# Patient Record
Sex: Male | Born: 1980
Health system: Southern US, Community
[De-identification: ages and names within clinical notes are randomized; demographics above are authoritative.]

## PROBLEM LIST (undated history)

## (undated) DIAGNOSIS — I10 Essential (primary) hypertension: Secondary | ICD-10-CM

## (undated) DIAGNOSIS — I517 Cardiomegaly: Secondary | ICD-10-CM

---

## 2003-11-28 ENCOUNTER — Ambulatory Visit: Payer: Self-pay | Admitting: Internal Medicine

## 2004-06-04 ENCOUNTER — Emergency Department (HOSPITAL_COMMUNITY): Admission: EM | Admit: 2004-06-04 | Discharge: 2004-06-04 | Payer: Self-pay | Admitting: Emergency Medicine

## 2004-07-08 ENCOUNTER — Inpatient Hospital Stay (HOSPITAL_COMMUNITY): Admission: AC | Admit: 2004-07-08 | Discharge: 2004-07-09 | Payer: Self-pay

## 2006-09-21 DIAGNOSIS — R519 Headache, unspecified: Secondary | ICD-10-CM | POA: Insufficient documentation

## 2006-09-21 DIAGNOSIS — R51 Headache: Secondary | ICD-10-CM | POA: Insufficient documentation

## 2007-03-28 ENCOUNTER — Emergency Department (HOSPITAL_COMMUNITY): Admission: EM | Admit: 2007-03-28 | Discharge: 2007-03-28 | Payer: Self-pay | Admitting: Emergency Medicine

## 2010-03-17 ENCOUNTER — Emergency Department (HOSPITAL_COMMUNITY)
Admission: EM | Admit: 2010-03-17 | Discharge: 2010-03-17 | Disposition: A | Discharge status: Home / Self Care | Payer: Self-pay | Attending: Emergency Medicine | Admitting: Emergency Medicine

## 2010-03-17 DIAGNOSIS — H6690 Otitis media, unspecified, unspecified ear: Secondary | ICD-10-CM | POA: Insufficient documentation

## 2010-03-17 DIAGNOSIS — H9209 Otalgia, unspecified ear: Secondary | ICD-10-CM | POA: Insufficient documentation

## 2010-08-12 ENCOUNTER — Inpatient Hospital Stay (INDEPENDENT_AMBULATORY_CARE_PROVIDER_SITE_OTHER)
Admission: RE | Admit: 2010-08-12 | Discharge: 2010-08-12 | Disposition: A | Discharge status: Home / Self Care | Payer: BC Managed Care – PPO | Source: Ambulatory Visit | Attending: Family Medicine | Admitting: Family Medicine

## 2010-08-12 DIAGNOSIS — R42 Dizziness and giddiness: Secondary | ICD-10-CM

## 2010-08-12 LAB — GLUCOSE, CAPILLARY: Glucose-Capillary: 113 mg/dL — ABNORMAL HIGH (ref 70–99)

## 2010-08-12 LAB — POCT URINALYSIS DIP (DEVICE): Protein, ur: NEGATIVE mg/dL

## 2010-10-11 LAB — CBC
HCT: 41.3
Hemoglobin: 13.8
MCHC: 33.5
MCV: 85.9
RDW: 13.2
WBC: 5.9

## 2010-10-11 LAB — DIFFERENTIAL
Basophils Relative: 1
Eosinophils Absolute: 0.1
Lymphs Abs: 1.7
Monocytes Absolute: 0.5
Neutro Abs: 3.5
Neutrophils Relative %: 60

## 2010-10-11 LAB — POCT I-STAT CREATININE: Creatinine, Ser: 1.5

## 2010-10-11 LAB — I-STAT 8, (EC8 V) (CONVERTED LAB)
Chloride: 109
HCT: 45
Operator id: 270651
Potassium: 4.1
Sodium: 141
TCO2: 25

## 2011-07-15 ENCOUNTER — Encounter (HOSPITAL_COMMUNITY): Payer: Self-pay

## 2011-07-15 ENCOUNTER — Emergency Department (HOSPITAL_COMMUNITY)
Admission: EM | Admit: 2011-07-15 | Discharge: 2011-07-15 | Disposition: A | Discharge status: Home / Self Care | Payer: BC Managed Care – PPO | Source: Home / Self Care | Attending: Family Medicine | Admitting: Family Medicine

## 2011-07-15 DIAGNOSIS — I517 Cardiomegaly: Secondary | ICD-10-CM | POA: Insufficient documentation

## 2011-07-15 DIAGNOSIS — R0789 Other chest pain: Secondary | ICD-10-CM

## 2011-07-15 HISTORY — DX: Cardiomegaly: I51.7

## 2011-07-15 HISTORY — DX: Essential (primary) hypertension: I10

## 2011-07-15 NOTE — ED Provider Notes (Signed)
History     CSN: [id number]  Arrival date & time 07/15/11  1246   First MD Initiated Contact with Patient 07/15/11 1247      Chief Complaint  Patient presents with  . Chest Pain    (Consider location/radiation/quality/duration/timing/severity/associated sxs/prior treatment) Patient is a 31 y.o. male presenting with chest pain. The history is provided by the patient.  Chest Pain The chest pain began yesterday (present last eve after work, had been sneezing a lot from new work at [employer].). Chest pain occurs constantly. The chest pain is unchanged. The pain is associated with coughing. The severity of the pain is mild. The quality of the pain is described as sharp. The pain does not radiate. Chest pain is worsened by certain positions. Pertinent negatives for primary symptoms include no shortness of breath, no cough, no wheezing and no palpitations.  His past medical history is significant for hypertension. Past medical history comments: known to have enlarged heart.     Past Medical History  Diagnosis Date  . Hypertension   . Enlarged heart     History reviewed. No pertinent past surgical history.  History reviewed. No pertinent family history.  History  Substance Use Topics  . Smoking status: Former Games developer  . Smokeless tobacco: Not on file  . Alcohol Use: Yes      Review of Systems  Constitutional: Negative.   HENT: Positive for sneezing.   Respiratory: Negative for cough, chest tightness, shortness of breath and wheezing.   Cardiovascular: Positive for chest pain. Negative for palpitations.  Neurological: Negative.     Allergies  Review of patient's allergies indicates not on file.  Home Medications   Current Outpatient Rx  Name Route Sig Dispense Refill  . OLMESARTAN MEDOXOMIL 40 MG PO TABS Oral Take 40 mg by mouth daily.      BP 104/70  Pulse 86  Temp 98.9 F (37.2 C) (Oral)  SpO2 96%  Physical Exam  Nursing note and vitals  reviewed. Constitutional: He is oriented to person, place, and time. He appears well-developed and well-nourished.  HENT:  Head: Normocephalic.  Mouth/Throat: Oropharynx is clear and moist.  Eyes: Conjunctivae are normal. Pupils are equal, round, and reactive to light.  Neck: Normal range of motion. Neck supple.  Cardiovascular: Normal rate, regular rhythm, normal heart sounds and intact distal pulses.   Pulmonary/Chest: Breath sounds normal. He exhibits tenderness.    Lymphadenopathy:    He has no cervical adenopathy.  Neurological: He is alert and oriented to person, place, and time.  Skin: Skin is warm and dry.    ED Course  Procedures (including critical care time)  Labs Reviewed - No data to display No results found.   1. Musculoskeletal chest pain       MDM  [id number]--[id number].        Linna Hoff, MD 07/15/11 1336

## 2011-07-15 NOTE — ED Notes (Addendum)
Chest pain since last when he got off work, woke this AM , still having pain, slept off and on , pain never left; pain is sharp, constant; using benicar for BP ; pain constant, sharp. C/o pain worse w direct pressure, deep breaths, or sneezing; NAD, w/d/color good

## 2011-07-15 NOTE — Discharge Instructions (Signed)
advil or tylenol for soreness, ok to work as scheduled.

## 2012-12-20 ENCOUNTER — Emergency Department (HOSPITAL_COMMUNITY): Payer: BC Managed Care – PPO

## 2012-12-20 ENCOUNTER — Emergency Department (INDEPENDENT_AMBULATORY_CARE_PROVIDER_SITE_OTHER): Payer: BC Managed Care – PPO

## 2012-12-20 ENCOUNTER — Emergency Department (INDEPENDENT_AMBULATORY_CARE_PROVIDER_SITE_OTHER)
Admission: EM | Admit: 2012-12-20 | Discharge: 2012-12-20 | Disposition: A | Discharge status: Home / Self Care | Payer: BC Managed Care – PPO | Source: Home / Self Care

## 2012-12-20 ENCOUNTER — Encounter (HOSPITAL_COMMUNITY): Payer: Self-pay | Admitting: Emergency Medicine

## 2012-12-20 DIAGNOSIS — J189 Pneumonia, unspecified organism: Secondary | ICD-10-CM

## 2012-12-20 MED ORDER — HYDROCODONE-ACETAMINOPHEN 5-325 MG PO TABS: 0.5000 | ORAL_TABLET | Freq: Every evening | ORAL | Status: DC | PRN

## 2012-12-20 MED ORDER — LEVOFLOXACIN 500 MG PO TABS: 500.0000 mg | ORAL_TABLET | Freq: Every day | ORAL | Status: DC

## 2012-12-20 NOTE — Discharge Instructions (Signed)
Thank you for coming in today. Take Levaquin daily for pneumonia. Use Norco for cough. Take up to 2 Aleve twice daily for pain. Call or go to the emergency room if you get worse, have trouble breathing, have chest pains, or palpitations.

## 2012-12-20 NOTE — ED Provider Notes (Addendum)
Seth Wang is a 32 y.o. male who presents to Urgent Care today for cough and right-sided back and chest pain present for the last several days. The pain is moderate. The pain is worse with deep inspiration and coughing. No fevers or chills. No nausea vomiting diarrhea. The pain is stabbing in nature. The pain is nonexertional and does not radiate. No shortness of breath or palpitations.   Past Medical History  Diagnosis Date  . Hypertension   . Enlarged heart    History  Substance Use Topics  . Smoking status: Former Games developer  . Smokeless tobacco: Not on file  . Alcohol Use: Yes   ROS as above Medications reviewed. No current facility-administered medications for this encounter.   Current Outpatient Prescriptions  Medication Sig Dispense Refill  . HYDROCHLOROTHIAZIDE PO Take by mouth.      Marland Kitchen LISINOPRIL PO Take by mouth.      Marland Kitchen HYDROcodone-acetaminophen (NORCO/VICODIN) 5-325 MG per tablet Take 0.5 tablets by mouth at bedtime as needed (cough).  6 tablet  0  . levofloxacin (LEVAQUIN) 500 MG tablet Take 1 tablet (500 mg total) by mouth daily.  10 tablet  0  . olmesartan (BENICAR) 40 MG tablet Take 40 mg by mouth daily.        Exam:  BP 153/92  Pulse 79  Temp(Src) 98.2 F (36.8 C) (Oral)  Resp 16  SpO2 100% Gen: Well NAD obese HEENT: EOMI,  MMM Lungs: Normal work of breathing. CTABL Heart: RRR no MRG Abd: NABS, Soft. NT, ND Exts: Non edematous BL  LE, warm and well perfused.   No results found for this or any previous visit (from the past 24 hour(s)). Dg Chest 2 View  12/20/2012   CLINICAL DATA:  Chest pain.  EXAM: CHEST  2 VIEW  COMPARISON:  Prior images could not be retrieved.  FINDINGS: Medial right base infiltrate present consistent with pneumonia. Poor inspiration. Cardiomegaly. No pulmonary venous congestion. No acute bony abnormality.  IMPRESSION: Medial right lung base infiltrate consistent with mild pneumonia. Poor inspiration.   Electronically Signed   By: Maisie Fus   Register   On: 12/20/2012 12:15   Twelve-lead EKG shows normal sinus rhythm at 77 beats per minute. No significant abnormalities noted.  Assessment and Plan: 32 y.o. male with right lobe community-acquired pneumonia.  Plan to treat with Levaquin, and hydrocodone for cough. Will use NSAIDs for severe pain. Recommend followup with primary care provider. Discussed warning signs or symptoms. Please see discharge instructions. Patient expresses understanding.      Rodolph Bong, MD 12/20/12 1229  Rodolph Bong, MD 12/20/12 [phone]

## 2012-12-20 NOTE — ED Notes (Signed)
Pt  Reports    Chest  Pain  More  On r  Side  Breath       As   Well  As  Shortness  Of  Breath   For  sev  Days    Describes  The  Pain as heavy        Pt  Reports  He  Recentlly  Got over  A  resp infection    Pt  Is  Sitting  Upright on exam table  Speaking in  Complete  sentances

## 2013-11-06 ENCOUNTER — Encounter (HOSPITAL_COMMUNITY): Payer: Self-pay | Admitting: Emergency Medicine

## 2013-11-06 ENCOUNTER — Emergency Department (HOSPITAL_COMMUNITY)
Admission: EM | Admit: 2013-11-06 | Discharge: 2013-11-06 | Disposition: A | Discharge status: Home / Self Care | Payer: BC Managed Care – PPO | Attending: Emergency Medicine | Admitting: Emergency Medicine

## 2013-11-06 DIAGNOSIS — Y92410 Unspecified street and highway as the place of occurrence of the external cause: Secondary | ICD-10-CM | POA: Diagnosis not present

## 2013-11-06 DIAGNOSIS — M549 Dorsalgia, unspecified: Secondary | ICD-10-CM

## 2013-11-06 DIAGNOSIS — Z87891 Personal history of nicotine dependence: Secondary | ICD-10-CM | POA: Diagnosis not present

## 2013-11-06 DIAGNOSIS — Y9389 Activity, other specified: Secondary | ICD-10-CM | POA: Insufficient documentation

## 2013-11-06 DIAGNOSIS — S3992XA Unspecified injury of lower back, initial encounter: Secondary | ICD-10-CM | POA: Insufficient documentation

## 2013-11-06 DIAGNOSIS — Z792 Long term (current) use of antibiotics: Secondary | ICD-10-CM | POA: Insufficient documentation

## 2013-11-06 DIAGNOSIS — S199XXA Unspecified injury of neck, initial encounter: Secondary | ICD-10-CM | POA: Diagnosis not present

## 2013-11-06 DIAGNOSIS — Z79899 Other long term (current) drug therapy: Secondary | ICD-10-CM | POA: Diagnosis not present

## 2013-11-06 DIAGNOSIS — I10 Essential (primary) hypertension: Secondary | ICD-10-CM | POA: Insufficient documentation

## 2013-11-06 DIAGNOSIS — M542 Cervicalgia: Secondary | ICD-10-CM

## 2013-11-06 MED ORDER — CYCLOBENZAPRINE HCL 10 MG PO TABS: 10.0000 mg | ORAL_TABLET | Freq: Two times a day (BID) | ORAL | Status: DC | PRN

## 2013-11-06 MED ORDER — IBUPROFEN 800 MG PO TABS: 800.0000 mg | ORAL_TABLET | Freq: Three times a day (TID) | ORAL | Status: DC

## 2013-11-06 MED ORDER — HYDROCODONE-ACETAMINOPHEN 5-325 MG PO TABS: 2.0000 | ORAL_TABLET | ORAL | Status: DC | PRN

## 2013-11-06 NOTE — Discharge Instructions (Signed)
Back Pain, Adult °Low back pain is very common. About 1 in 5 people have back pain. The cause of low back pain is rarely dangerous. The pain often gets better over time. About half of people with a sudden onset of back pain feel better in just 2 weeks. About 8 in 10 people feel better by 6 weeks.  °CAUSES °Some common causes of back pain include: °· Strain of the muscles or ligaments supporting the spine. °· Wear and tear (degeneration) of the spinal discs. °· Arthritis. °· Direct injury to the back. °DIAGNOSIS °Most of the time, the direct cause of low back pain is not known. However, back pain can be treated effectively even when the exact cause of the pain is unknown. Answering your caregiver's questions about your overall health and symptoms is one of the most accurate ways to make sure the cause of your pain is not dangerous. If your caregiver needs more information, he or she may order lab work or imaging tests (X-rays or MRIs). However, even if imaging tests show changes in your back, this usually does not require surgery. °HOME CARE INSTRUCTIONS °For many people, back pain returns. Since low back pain is rarely dangerous, it is often a condition that people can learn to manage on their own.  °· Remain active. It is stressful on the back to sit or stand in one place. Do not sit, drive, or stand in one place for more than 30 minutes at a time. Take short walks on level surfaces as soon as pain allows. Try to increase the length of time you walk each day. °· Do not stay in bed. Resting more than 1 or 2 days can delay your recovery. °· Do not avoid exercise or work. Your body is made to move. It is not dangerous to be active, even though your back may hurt. Your back will likely heal faster if you return to being active before your pain is gone. °· Pay attention to your body when you  bend and lift. Many people have less discomfort when lifting if they bend their knees, keep the load close to their bodies, and  avoid twisting. Often, the most comfortable positions are those that put less stress on your recovering back. °· Find a comfortable position to sleep. Use a firm mattress and lie on your side with your knees slightly bent. If you lie on your back, put a pillow under your knees. °· Only take over-the-counter or prescription medicines as directed by your caregiver. Over-the-counter medicines to reduce pain and inflammation are often the most helpful. Your caregiver may prescribe muscle relaxant drugs. These medicines help dull your pain so you can more quickly return to your normal activities and healthy exercise. °· Put ice on the injured area. °· Put ice in a plastic bag. °· Place a towel between your skin and the bag. °· Leave the ice on for 15-20 minutes, 03-04 times a day for the first 2 to 3 days. After that, ice and heat may be alternated to reduce pain and spasms. °· Ask your caregiver about trying back exercises and gentle massage. This may be of some benefit. °· Avoid feeling anxious or stressed. Stress increases muscle tension and can worsen back pain. It is important to recognize when you are anxious or stressed and learn ways to manage it. Exercise is a great option. °SEEK MEDICAL CARE IF: °· You have pain that is not relieved with rest or medicine. °· You have pain that does not improve in 1 week. °· You have new symptoms. °· You are generally not feeling well. °SEEK   IMMEDIATE MEDICAL CARE IF:  °· You have pain that radiates from your back into your legs. °· You develop new bowel or bladder control problems. °· You have unusual weakness or numbness in your arms or legs. °· You develop nausea or vomiting. °· You develop abdominal pain. °· You feel faint. °Document Released: 01/03/2005 Document Revised: 07/05/2011 Document Reviewed: 05/07/2013 °ExitCare® Patient Information ©2015 [employer]. This information is not intended to replace advice given to you by your health care provider. Make sure you  discuss any questions you have with your health care provider. ° °Cervical Sprain °A cervical sprain is an injury in the neck in which the strong, fibrous tissues (ligaments) that connect your neck bones stretch or tear. Cervical sprains can range from mild to severe. Severe cervical sprains can cause the neck vertebrae to be unstable. This can lead to damage of the spinal cord and can result in serious nervous system problems. The amount of time it takes for a cervical sprain to get better depends on the cause and extent of the injury. Most cervical sprains heal in 1 to 3 weeks. °CAUSES  °Severe cervical sprains may be caused by:  °· Contact sport injuries (such as from football, rugby, wrestling, hockey, auto racing, gymnastics, diving, martial arts, or boxing).   °· Motor vehicle collisions.   °· Whiplash injuries. This is an injury from a sudden forward and backward whipping movement of the head and neck.  °· Falls.   °Mild cervical sprains may be caused by:  °· Being in an awkward position, such as while cradling a telephone between your ear and shoulder.   °· Sitting in a chair that does not offer proper support.   °· Working at a poorly designed computer station.   °· Looking up or down for long periods of time.   °SYMPTOMS  °· Pain, soreness, stiffness, or a burning sensation in the front, back, or sides of the neck. This discomfort may develop immediately after the injury or slowly, 24 hours or more after the injury.   °· Pain or tenderness directly in the middle of the back of the neck.   °· Shoulder or upper back pain.   °· Limited ability to move the neck.   °· Headache.   °· Dizziness.   °· Weakness, numbness, or tingling in the hands or arms.   °· Muscle spasms.   °· Difficulty swallowing or chewing.   °· Tenderness and swelling of the neck.   °DIAGNOSIS  °Most of the time your health care provider can diagnose a cervical sprain by taking your history and doing a physical exam. Your health care  provider will ask about previous neck injuries and any known neck problems, such as arthritis in the neck. X-rays may be taken to find out if there are any other problems, such as with the bones of the neck. Other tests, such as a CT scan or MRI, may also be needed.  °TREATMENT  °Treatment depends on the severity of the cervical sprain. Mild sprains can be treated with rest, keeping the neck in place (immobilization), and pain medicines. Severe cervical sprains are immediately immobilized. Further treatment is done to help with pain, muscle spasms, and other symptoms and may include: °· Medicines, such as pain relievers, numbing medicines, or muscle relaxants.   °· Physical therapy. This may involve stretching exercises, strengthening exercises, and posture training. Exercises and improved posture can help stabilize the neck, strengthen muscles, and help stop symptoms from returning.   °HOME CARE INSTRUCTIONS  °· Put ice on the injured area.   °¨ Put ice in a   plastic bag.   °¨ Place a towel between your skin and the bag.   °¨ Leave the ice on for 15-20 minutes, 3-4 times a day.   °· If your injury was severe, you may have been given a cervical collar to wear. A cervical collar is a two-piece collar designed to keep your neck from moving while it heals. °¨ Do not remove the collar unless instructed by your health care provider. °¨ If you have long hair, keep it outside of the collar. °¨ Ask your health care provider before making any adjustments to your collar. Minor adjustments may be required over time to improve comfort and reduce pressure on your chin or on the back of your head. °¨ If you are allowed to remove the collar for cleaning or bathing, follow your health care provider's instructions on how to do so safely. °¨ Keep your collar clean by wiping it with mild soap and water and drying it completely. If the collar you have been given includes removable pads, remove them every 1-2 days and hand wash them with  soap and water. Allow them to air dry. They should be completely dry before you wear them in the collar. °¨ If you are allowed to remove the collar for cleaning and bathing, wash and dry the skin of your neck. Check your skin for irritation or sores. If you see any, tell your health care provider. °¨ Do not drive while wearing the collar.   °· Only take over-the-counter or prescription medicines for pain, discomfort, or fever as directed by your health care provider.   °· Keep all follow-up appointments as directed by your health care provider.   °· Keep all physical therapy appointments as directed by your health care provider.   °· Make any needed adjustments to your workstation to promote good posture.   °· Avoid positions and activities that make your symptoms worse.   °· Warm up and stretch before being active to help prevent problems.   °SEEK MEDICAL CARE IF:  °· Your pain is not controlled with medicine.   °· You are unable to decrease your pain medicine over time as planned.   °· Your activity level is not improving as expected.   °SEEK IMMEDIATE MEDICAL CARE IF:  °· You develop any bleeding. °· You develop stomach upset. °· You have signs of an allergic reaction to your medicine.   °· Your symptoms get worse.   °· You develop new, unexplained symptoms.   °· You have numbness, tingling, weakness, or paralysis in any part of your body.   °MAKE SURE YOU:  °· Understand these instructions. °· Will watch your condition. °· Will get help right away if you are not doing well or get worse. °Document Released: 10/31/2006 Document Revised: 01/08/2013 Document Reviewed: 07/11/2012 °ExitCare® Patient Information ©2015 [employer]. This information is not intended to replace advice given to you by your health care provider. Make sure you discuss any questions you have with your health care provider. ° °Motor Vehicle Collision °It is common to have multiple bruises and sore muscles after a motor vehicle collision  (MVC). These tend to feel worse for the first 24 hours. You may have the most stiffness and soreness over the first several hours. You may also feel worse when you wake up the first morning after your collision. After this point, you will usually begin to improve with each day. The speed of improvement often depends on the severity of the collision, the number of injuries, and the location and nature of these injuries. °HOME CARE INSTRUCTIONS °· Put ice   on the injured area. °¨ Put ice in a plastic bag. °¨ Place a towel between your skin and the bag. °¨ Leave the ice on for 15-20 minutes, 3-4 times a day, or as directed by your health care provider. °· Drink enough fluids to keep your urine clear or pale yellow. Do not drink alcohol. °· Take a warm shower or bath once or twice a day. This will increase blood flow to sore muscles. °· You may return to activities as directed by your caregiver. Be careful when lifting, as this may aggravate neck or back pain. °· Only take over-the-counter or prescription medicines for pain, discomfort, or fever as directed by your caregiver. Do not use aspirin. This may increase bruising and bleeding. °SEEK IMMEDIATE MEDICAL CARE IF: °· You have numbness, tingling, or weakness in the arms or legs. °· You develop severe headaches not relieved with medicine. °· You have severe neck pain, especially tenderness in the middle of the back of your neck. °· You have changes in bowel or bladder control. °· There is increasing pain in any area of the body. °· You have shortness of breath, light-headedness, dizziness, or fainting. °· You have chest pain. °· You feel sick to your stomach (nauseous), throw up (vomit), or sweat. °· You have increasing abdominal discomfort. °· There is blood in your urine, stool, or vomit. °· You have pain in your shoulder (shoulder strap areas). °· You feel your symptoms are getting worse. °MAKE SURE YOU: °· Understand these instructions. °· Will watch your  condition. °· Will get help right away if you are not doing well or get worse. °Document Released: 01/03/2005 Document Revised: 05/20/2013 Document Reviewed: 06/02/2010 °ExitCare® Patient Information ©2015 [employer]. This information is not intended to replace advice given to you by your health care provider. Make sure you discuss any questions you have with your health care provider. ° °
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
## 2013-11-07 NOTE — ED Provider Notes (Signed)
Medical screening examination/treatment/procedure(s) were performed by non-physician practitioner and as supervising physician I was immediately available for consultation/collaboration.   EKG Interpretation None        Gwyneth SproutWhitney Sacred Roa, MD 11/07/13 2344

## 2016-01-24 ENCOUNTER — Encounter (HOSPITAL_BASED_OUTPATIENT_CLINIC_OR_DEPARTMENT_OTHER): Payer: Self-pay | Admitting: *Deleted

## 2016-01-24 ENCOUNTER — Emergency Department (HOSPITAL_BASED_OUTPATIENT_CLINIC_OR_DEPARTMENT_OTHER)
Admission: EM | Admit: 2016-01-24 | Discharge: 2016-01-24 | Disposition: A | Discharge status: Home / Self Care | Payer: BLUE CROSS/BLUE SHIELD | Attending: Emergency Medicine | Admitting: Emergency Medicine

## 2016-01-24 DIAGNOSIS — Z87891 Personal history of nicotine dependence: Secondary | ICD-10-CM | POA: Insufficient documentation

## 2016-01-24 DIAGNOSIS — R0602 Shortness of breath: Secondary | ICD-10-CM | POA: Diagnosis not present

## 2016-01-24 DIAGNOSIS — J111 Influenza due to unidentified influenza virus with other respiratory manifestations: Secondary | ICD-10-CM

## 2016-01-24 DIAGNOSIS — J3489 Other specified disorders of nose and nasal sinuses: Secondary | ICD-10-CM | POA: Insufficient documentation

## 2016-01-24 DIAGNOSIS — R05 Cough: Secondary | ICD-10-CM | POA: Diagnosis not present

## 2016-01-24 DIAGNOSIS — R0789 Other chest pain: Secondary | ICD-10-CM | POA: Insufficient documentation

## 2016-01-24 DIAGNOSIS — I10 Essential (primary) hypertension: Secondary | ICD-10-CM | POA: Diagnosis not present

## 2016-01-24 DIAGNOSIS — R0981 Nasal congestion: Secondary | ICD-10-CM | POA: Diagnosis not present

## 2016-01-24 DIAGNOSIS — R69 Illness, unspecified: Secondary | ICD-10-CM

## 2016-01-24 DIAGNOSIS — R197 Diarrhea, unspecified: Secondary | ICD-10-CM | POA: Insufficient documentation

## 2016-01-24 MED ORDER — ACETAMINOPHEN 325 MG PO TABS
650.0000 mg | ORAL_TABLET | Freq: Once | ORAL | Status: AC | PRN
  Administered 2016-01-24: 650 mg via ORAL
  Filled 2016-01-24: qty 2

## 2016-01-24 MED ORDER — HYDROCODONE-HOMATROPINE 5-1.5 MG/5ML PO SYRP: 5.0000 mL | ORAL_SOLUTION | Freq: Four times a day (QID) | ORAL | 0 refills | Status: AC | PRN

## 2016-01-24 NOTE — ED Notes (Signed)
Pt given d/c instructions as per chart. Verbalizes understanding. No questions. 

## 2016-01-24 NOTE — ED Provider Notes (Signed)
[facility] Provider Note   CSN: [id number] Arrival date & time: 01/24/16  1445   By signing my name below, I, Teofilo PodMatthew P. Jamison, attest that this documentation has been prepared under the direction and in the presence of Gwyneth SproutWhitney Sophina Mitten, MD . Electronically Signed: Teofilo PodMatthew P. Jamison, ED Scribe. 01/24/2016. 4:20 PM.   History   Chief Complaint Chief Complaint  Patient presents with  . URI    The history is provided by the patient. No language interpreter was used.  HPI Comments:  Seth Wang is a 36 y.o. male with PMHx of an enlarged heart who presents to the Emergency Department complaining of multiple URI symptoms x 1 day. Pt complains of associated chest pain discomfort with coughing, congestion, nonproductive cough, rhinorrhea, chills, diarrhea, and intermittent SOB. Pt states that his chest pain is worsened when coughing. Pt reports sick contact with his 2 children. Pt is a smoker and drinks EtOH occasionally. No alleviating factors noted. Pt denies vomiting, abdominal pain, sore throat.   Past Medical History:  Diagnosis Date  . Enlarged heart   . Hypertension     Patient Active Problem List   Diagnosis Date Noted  . Enlarged heart   . HEADACHE 09/21/2006    History reviewed. No pertinent surgical history.     Home Medications    Prior to Admission medications   Not on File    Family History History reviewed. No pertinent family history.  Social History Social History  Substance Use Topics  . Smoking status: Former Smoker    Packs/day: 0.50    Types: Cigarettes  . Smokeless tobacco: Never Used  . Alcohol use Yes     Allergies   Patient has no known allergies.   Review of Systems Review of Systems  Constitutional: Positive for chills.  HENT: Positive for congestion and rhinorrhea. Negative for sore throat.   Respiratory: Positive for cough and shortness of breath.   Cardiovascular: Positive for chest pain.  Gastrointestinal:  Positive for diarrhea. Negative for abdominal pain and vomiting.  All other systems reviewed and are negative.    Physical Exam Updated Vital Signs BP (!) 159/108 (BP Location: Left Arm) Comment: noncompliant with BP meds x 4-5 months.  Pulse 90   Temp 98.3 F (36.8 C) (Oral)   Resp 18   Ht 5' 9.75" (1.772 m)   Wt (!) 328 lb (148.8 kg)   SpO2 99%   BMI 47.40 kg/m   Physical Exam  Constitutional: He appears well-developed and well-nourished.  HENT:  Head: Normocephalic and atraumatic.  Swollen nasal turbinates, bilateral middle ear effusions.   Eyes: Conjunctivae are normal.  Neck: Neck supple.  Cardiovascular: Normal rate, regular rhythm, normal heart sounds and intact distal pulses.  Exam reveals no friction rub.   No murmur heard. Pulmonary/Chest: Effort normal and breath sounds normal. No respiratory distress. He has no wheezes. He has no rales.  Abdominal: Soft. There is no tenderness.  Musculoskeletal: He exhibits no edema.  Neurological: He is alert.  Skin: Skin is warm and dry.  Psychiatric: He has a normal mood and affect.  Nursing note and vitals reviewed.    ED Treatments / Results  DIAGNOSTIC STUDIES:  Oxygen Saturation is 99% on RA, normal by my interpretation.    COORDINATION OF CARE:  4:20 PM Discussed treatment plan with pt at bedside and pt agreed to plan.   Labs (all labs ordered are listed, but only abnormal results are displayed) Labs Reviewed - No data to  display  EKG  EKG Interpretation None       Radiology No results found.  Procedures Procedures (including critical care time)  Medications Ordered in ED Medications - No data to display   Initial Impression / Assessment and Plan / ED Course  I have reviewed the triage vital signs and the nursing notes.  Pertinent labs & imaging results that were available during my care of the patient were reviewed by me and considered in my medical decision making (see chart for  details).  Clinical Course     Pt with symptoms consistent with influenza.  Normal exam here but is febrile.  No signs of breathing difficulty  No signs of strep pharyngitis, otitis or abnormal abdominal findings.   Will continue antipyretica and rest and fluids and return for any further problems.  Final Clinical Impressions(s) / ED Diagnoses   Final diagnoses:  Influenza-like illness    New Prescriptions New Prescriptions   HYDROCODONE-HOMATROPINE (HYCODAN) 5-1.5 MG/5ML SYRUP    Take 5 mLs by mouth every 6 (six) hours as needed for cough.   I personally performed the services described in this documentation, which was scribed in my presence.  The recorded information has been reviewed and considered.     Gwyneth Sprout, MD 01/24/16 1630

## 2016-01-24 NOTE — ED Triage Notes (Signed)
URI x 1 day.  unknown fever PTA.

## 2016-01-24 NOTE — ED Notes (Signed)
ED Provider at bedside. 

## 2016-02-26 ENCOUNTER — Emergency Department (HOSPITAL_COMMUNITY)
Admission: EM | Admit: 2016-02-26 | Discharge: 2016-02-26 | Disposition: A | Discharge status: Home / Self Care | Payer: BLUE CROSS/BLUE SHIELD | Attending: Emergency Medicine | Admitting: Emergency Medicine

## 2016-02-26 ENCOUNTER — Encounter (HOSPITAL_COMMUNITY): Payer: Self-pay | Admitting: *Deleted

## 2016-02-26 DIAGNOSIS — R202 Paresthesia of skin: Secondary | ICD-10-CM | POA: Insufficient documentation

## 2016-02-26 DIAGNOSIS — Z87891 Personal history of nicotine dependence: Secondary | ICD-10-CM | POA: Diagnosis not present

## 2016-02-26 DIAGNOSIS — I10 Essential (primary) hypertension: Secondary | ICD-10-CM | POA: Insufficient documentation

## 2016-02-26 DIAGNOSIS — M79602 Pain in left arm: Secondary | ICD-10-CM | POA: Diagnosis present

## 2016-02-26 MED ORDER — HYDROCHLOROTHIAZIDE 25 MG PO TABS: 25.0000 mg | ORAL_TABLET | Freq: Every day | ORAL | 0 refills | Status: AC

## 2016-02-26 NOTE — ED Provider Notes (Signed)
[facility] Provider Note   CSN: [id number] Arrival date & time: 02/26/16  1046   By signing my name below, I, Teofilo PodMatthew P. Jamison, attest that this documentation has been prepared under the direction and in the presence of Fayrene HelperBowie Coriana Angello, PA-C. Electronically Signed: Teofilo PodMatthew P. Jamison, ED Scribe. 02/26/2016. 12:54 PM.   History   Chief Complaint Chief Complaint  Patient presents with  . Arm Pain    The history is provided by the patient. No language interpreter was used.   HPI Comments:  Greg CutterLamar Washabaugh is a 36 y.o. male with PMHx of HTN and enlarged heart who presents to the Emergency Department complaining of intermittent left arm numbness x 2 weeks. Pt states that his arm is not painful, that the numbness radiates throughout his left arm, and states that yesterday the numbness did not resolve. He states that at this time only his left hand is numb. Pt rates the discomfort at 6/10. Pt complains of associated intermittent chest pain/SOB. Pt states that he has not had chest pain since having a 40 minute episode yesterday. Pt states that he has been out of his hydrochlorothiazide for 3 months. Pt is a smoker, is right hand dominant, and denies family hx of cardiac problems or MS. No alleviating factors noted. Pt denies headache, neck pain, lightheadedness, dizziness, diaphoresis, visual changes.   Past Medical History:  Diagnosis Date  . Enlarged heart   . Hypertension     Patient Active Problem List   Diagnosis Date Noted  . Enlarged heart   . HEADACHE 09/21/2006    History reviewed. No pertinent surgical history.     Home Medications    Prior to Admission medications   Medication Sig Start Date End Date Taking? Authorizing Provider  HYDROcodone-homatropine (HYCODAN) 5-1.5 MG/5ML syrup Take 5 mLs by mouth every 6 (six) hours as needed for cough. 01/24/16   Gwyneth SproutWhitney Plunkett, MD    Family History No family history on file.  Social History Social History  Substance  Use Topics  . Smoking status: Former Smoker    Packs/day: 0.50    Types: Cigarettes  . Smokeless tobacco: Never Used  . Alcohol use Yes     Allergies   Patient has no known allergies.   Review of Systems Review of Systems  Constitutional: Negative for diaphoresis.  Eyes: Negative for visual disturbance.  Respiratory: Positive for shortness of breath.   Cardiovascular: Positive for chest pain.  Musculoskeletal: Negative for myalgias and neck pain.  Neurological: Positive for numbness. Negative for light-headedness and headaches.     Physical Exam Updated Vital Signs BP 141/97 (BP Location: Left Arm)   Pulse 77   Temp 97.6 F (36.4 C) (Oral)   Resp 14   Ht 5' 9.75" (1.772 m)   Wt (!) 330 lb (149.7 kg)   SpO2 100%   BMI 47.69 kg/m   Physical Exam  Constitutional: He appears well-developed and well-nourished. No distress.  HENT:  Head: Normocephalic and atraumatic.  Eyes: Conjunctivae are normal.  Cardiovascular: Normal rate, regular rhythm and normal heart sounds.   Pulmonary/Chest: Effort normal and breath sounds normal. He has no wheezes. He has no rales.  Abdominal: He exhibits no distension.  Musculoskeletal:  No cervical spine tenderness, neck with full ROM. Subjective decrease in sensation throughout left arm from shoulder to the tip of the finger that does not follow any specific pattern. Normal grip strength, radial pulse 2+.   Neurological: He is alert.  Skin: Skin is warm and  dry. Capillary refill takes less than 2 seconds.  Psychiatric: He has a normal mood and affect.  Nursing note and vitals reviewed.    ED Treatments / Results  DIAGNOSTIC STUDIES:  Oxygen Saturation is 100% on RA, normal by my interpretation.    COORDINATION OF CARE:  12:51 PM Will refill blood pressure medication. Discussed treatment plan with pt at bedside and pt agreed to plan.   Labs (all labs ordered are listed, but only abnormal results are displayed) Labs Reviewed -  No data to display  EKG  EKG Interpretation None       Radiology No results found.  Procedures Procedures (including critical care time)  Medications Ordered in ED Medications - No data to display   Initial Impression / Assessment and Plan / ED Course  I have reviewed the triage vital signs and the nursing notes.  Pertinent labs & imaging results that were available during my care of the patient were reviewed by me and considered in my medical decision making (see chart for details).     BP 141/97 (BP Location: Left Arm)   Pulse 77   Temp 97.6 F (36.4 C) (Oral)   Resp 14   Ht 5' 9.75" (1.772 m)   Wt (!) 149.7 kg   SpO2 100%   BMI 47.69 kg/m    Final Clinical Impressions(s) / ED Diagnoses   Final diagnoses:  Paresthesia of left arm  Asymptomatic hypertension    New Prescriptions New Prescriptions   HYDROCHLOROTHIAZIDE (HYDRODIURIL) 25 MG TABLET    Take 1 tablet (25 mg total) by mouth daily.   1:07 PM Pt here with intermittent paresthesia of the L arm x 2 weeks.  He has subjective finding but no objective finding on exam.  He has normal grip strength, intact bicep and brachioradialis DTR.  Radial pulse intact.  Suspect brachial plexus radiculopathy as he has tenderness at the ERBs point.  He would benefit from close f/u with PCP for further evaluation, including cardiac work up, r/o MS, or other underlying pathology.  I did discussed this with DR. Ray.  Will refill his BP medication.  Return precaution given.  Pt voice understanding and agrees with plan.   I personally performed the services described in this documentation, which was scribed in my presence. The recorded information has been reviewed and is accurate.       Fayrene Helper, PA-C 02/26/16 1310    Margarita Grizzle, MD 03/03/16 1351

## 2016-02-26 NOTE — ED Notes (Signed)
Pt states he understands instructions and will follow up with PMD. Home stable with steady gait.

## 2016-02-26 NOTE — Discharge Instructions (Signed)
Please follow up closely with your primary care provider for further evaluation of your left arm numbness. You will also need to get your medication refill from your primary care provider as well.

## 2016-02-26 NOTE — ED Triage Notes (Signed)
Pt c/o L arm numbness onset x 2 wks ago, pt denies slurred speech, no facial droop, no injury to the area, pt ambulatory, A&O x4

## 2016-02-26 NOTE — ED Notes (Signed)
Unable to sign due to computer. Pt verbalizes understanding. 

## 2016-05-11 DIAGNOSIS — S29012A Strain of muscle and tendon of back wall of thorax, initial encounter: Secondary | ICD-10-CM | POA: Diagnosis not present

## 2016-05-11 DIAGNOSIS — F17210 Nicotine dependence, cigarettes, uncomplicated: Secondary | ICD-10-CM | POA: Diagnosis not present

## 2016-05-11 DIAGNOSIS — R109 Unspecified abdominal pain: Secondary | ICD-10-CM | POA: Diagnosis not present

## 2016-05-11 DIAGNOSIS — I10 Essential (primary) hypertension: Secondary | ICD-10-CM | POA: Diagnosis not present

## 2016-10-10 ENCOUNTER — Encounter (HOSPITAL_COMMUNITY): Payer: Self-pay

## 2016-10-10 ENCOUNTER — Emergency Department (HOSPITAL_COMMUNITY)
Admission: EM | Admit: 2016-10-10 | Discharge: 2016-10-10 | Disposition: A | Discharge status: Home / Self Care | Payer: BLUE CROSS/BLUE SHIELD | Attending: Emergency Medicine | Admitting: Emergency Medicine

## 2016-10-10 DIAGNOSIS — Y9389 Activity, other specified: Secondary | ICD-10-CM | POA: Diagnosis not present

## 2016-10-10 DIAGNOSIS — S060X0A Concussion without loss of consciousness, initial encounter: Secondary | ICD-10-CM | POA: Diagnosis not present

## 2016-10-10 DIAGNOSIS — S46912A Strain of unspecified muscle, fascia and tendon at shoulder and upper arm level, left arm, initial encounter: Secondary | ICD-10-CM

## 2016-10-10 DIAGNOSIS — Y998 Other external cause status: Secondary | ICD-10-CM | POA: Insufficient documentation

## 2016-10-10 DIAGNOSIS — Y92410 Unspecified street and highway as the place of occurrence of the external cause: Secondary | ICD-10-CM | POA: Insufficient documentation

## 2016-10-10 DIAGNOSIS — Z87891 Personal history of nicotine dependence: Secondary | ICD-10-CM | POA: Insufficient documentation

## 2016-10-10 DIAGNOSIS — I10 Essential (primary) hypertension: Secondary | ICD-10-CM | POA: Insufficient documentation

## 2016-10-10 DIAGNOSIS — S0990XA Unspecified injury of head, initial encounter: Secondary | ICD-10-CM | POA: Diagnosis not present

## 2016-10-10 MED ORDER — ACETAMINOPHEN 325 MG PO TABS
650.0000 mg | ORAL_TABLET | Freq: Once | ORAL | Status: AC
  Administered 2016-10-10: 650 mg via ORAL
  Filled 2016-10-10: qty 2
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
## 2017-06-10 DIAGNOSIS — M545 Low back pain: Secondary | ICD-10-CM | POA: Diagnosis not present

## 2017-06-10 DIAGNOSIS — X58XXXA Exposure to other specified factors, initial encounter: Secondary | ICD-10-CM | POA: Diagnosis not present

## 2017-06-10 DIAGNOSIS — Y929 Unspecified place or not applicable: Secondary | ICD-10-CM | POA: Diagnosis not present

## 2017-06-10 DIAGNOSIS — S39012A Strain of muscle, fascia and tendon of lower back, initial encounter: Secondary | ICD-10-CM | POA: Diagnosis not present

## 2017-06-27 ENCOUNTER — Other Ambulatory Visit: Payer: Self-pay

## 2017-06-27 ENCOUNTER — Encounter (HOSPITAL_BASED_OUTPATIENT_CLINIC_OR_DEPARTMENT_OTHER): Payer: Self-pay

## 2017-06-27 ENCOUNTER — Emergency Department (HOSPITAL_BASED_OUTPATIENT_CLINIC_OR_DEPARTMENT_OTHER)
Admission: EM | Admit: 2017-06-27 | Discharge: 2017-06-27 | Disposition: A | Discharge status: Home / Self Care | Payer: BLUE CROSS/BLUE SHIELD | Attending: Emergency Medicine | Admitting: Emergency Medicine

## 2017-06-27 DIAGNOSIS — J020 Streptococcal pharyngitis: Secondary | ICD-10-CM | POA: Diagnosis not present

## 2017-06-27 DIAGNOSIS — J029 Acute pharyngitis, unspecified: Secondary | ICD-10-CM | POA: Diagnosis not present

## 2017-06-27 DIAGNOSIS — I10 Essential (primary) hypertension: Secondary | ICD-10-CM | POA: Insufficient documentation

## 2017-06-27 DIAGNOSIS — Z79899 Other long term (current) drug therapy: Secondary | ICD-10-CM | POA: Diagnosis not present

## 2017-06-27 DIAGNOSIS — F17210 Nicotine dependence, cigarettes, uncomplicated: Secondary | ICD-10-CM | POA: Insufficient documentation

## 2017-06-27 DIAGNOSIS — R05 Cough: Secondary | ICD-10-CM | POA: Diagnosis not present

## 2017-06-27 LAB — RAPID STREP SCREEN (MED CTR MEBANE ONLY): STREPTOCOCCUS, GROUP A SCREEN (DIRECT): POSITIVE — AB

## 2017-06-27 MED ORDER — PREDNISONE 10 MG PO TABS
60.0000 mg | ORAL_TABLET | Freq: Once | ORAL | Status: AC
  Administered 2017-06-27: 60 mg via ORAL
  Filled 2017-06-27: qty 1

## 2017-06-27 MED ORDER — IBUPROFEN 800 MG PO TABS
800.0000 mg | ORAL_TABLET | Freq: Once | ORAL | Status: AC
  Administered 2017-06-27: 800 mg via ORAL
  Filled 2017-06-27: qty 1

## 2017-06-27 MED ORDER — PENICILLIN G BENZATHINE 1200000 UNIT/2ML IM SUSP
1.2000 10*6.[IU] | Freq: Once | INTRAMUSCULAR | Status: AC
  Administered 2017-06-27: 1.2 10*6.[IU] via INTRAMUSCULAR
  Filled 2017-06-27: qty 2

## 2017-06-27 MED ORDER — IBUPROFEN 600 MG PO TABS: 600.0000 mg | ORAL_TABLET | Freq: Four times a day (QID) | ORAL | 0 refills | Status: AC | PRN

## 2017-06-27 MED ORDER — LIDOCAINE VISCOUS HCL 2 % MT SOLN: 15.0000 mL | OROMUCOSAL | 0 refills | Status: AC | PRN

## 2017-06-27 MED ORDER — PREDNISONE 10 MG PO TABS: 40.0000 mg | ORAL_TABLET | Freq: Every day | ORAL | 0 refills | Status: AC

## 2017-06-27 NOTE — ED Provider Notes (Signed)
[facility] EMERGENCY DEPARTMENT Provider Note   CSN: [id number] Arrival date & time: 06/27/17  1955     History   Chief Complaint Chief Complaint  Patient presents with  . Cough    HPI Seth Wang is a 37 y.o. male with a history of hypertension presents emergency department today for sore throat x2 days.  Patient notes that he woke up 2 days ago with sore throat with associated dysphasia as well as nasal congestion, left ear fullness and a dry cough.  He reports that he has had a subjective fever prior to arrival but was found to have temperature when he arrived.  He has been trying over-the-counter Alka-Seltzer for his symptoms without any relief.  He is unsure of sick contacts. Denies chills, inability to control secretions, N/V, abdominal pain, voice change, dental disease, or trauma.   HPI  Past Medical History:  Diagnosis Date  . Enlarged heart   . Hypertension     Patient Active Problem List   Diagnosis Date Noted  . Enlarged heart   . HEADACHE 09/21/2006    History reviewed. No pertinent surgical history.      Home Medications    Prior to Admission medications   Medication Sig Start Date End Date Taking? Authorizing Provider  hydrochlorothiazide (HYDRODIURIL) 25 MG tablet Take 1 tablet (25 mg total) by mouth daily. 02/26/16  Yes Fayrene Helperran, [place], PA-C  HYDROcodone-homatropine [facility](HYCODAN) 5-1.5 MG/5ML syrup Take 5 mLs by mouth every 6 (six) hours as needed for cough. 01/24/16   Gwyneth SproutPlunkett, Whitney, MD    Family History No family history on file.  Social History Social History   Tobacco Use  . Smoking status: Current Every Day Smoker    Packs/day: 0.50    Types: Cigarettes  . Smokeless tobacco: Never Used  Substance Use Topics  . Alcohol use: Yes    Comment: occ  . Drug use: No     Allergies   Patient has no known allergies.   Review of Systems Review of Systems  All other systems reviewed and are negative.    Physical Exam Updated  Vital Signs BP (!) 163/95 (BP Location: Left Arm)   Pulse (!) 104   Temp 99.7 F (37.6 C) (Oral)   Resp 20   Ht 5\' 9"  (1.753 m)   Wt (!) 149.2 kg (329 lb)   SpO2 96%   BMI 48.58 kg/m   Physical Exam  Constitutional: He appears well-developed and well-nourished.  HENT:  Head: Normocephalic and atraumatic.  Right Ear: Tympanic membrane, external ear and ear canal normal.  Left Ear: Tympanic membrane, external ear and ear canal normal.  Nose: Mucosal edema present. Right sinus exhibits no maxillary sinus tenderness and no frontal sinus tenderness. Left sinus exhibits no maxillary sinus tenderness and no frontal sinus tenderness.  Mouth/Throat: Uvula is midline, oropharynx is clear and moist and mucous membranes are normal. No tonsillar exudate.  The patient has normal phonation and is in control of secretions. No stridor.  Midline uvula without edema. Soft palate rises symmetrically. 2+ tonsils with erythema & exudates. No PTA. Tongue protrusion is normal. No trismus. No creptius on neck palpation and patient has good dentition. No gingival erythema or fluctuance noted. Mucus membranes moist.   Eyes: Pupils are equal, round, and reactive to light. Right eye exhibits no discharge. Left eye exhibits no discharge. No scleral icterus.  Neck: Trachea normal. Neck supple. No spinous process tenderness present. No neck rigidity. Normal range of motion present.  Cardiovascular: Normal rate, regular rhythm and intact distal pulses.  No murmur heard. Pulses:      Radial pulses are 2+ on the right side, and 2+ on the left side.       Dorsalis pedis pulses are 2+ on the right side, and 2+ on the left side.       Posterior tibial pulses are 2+ on the right side, and 2+ on the left side.  No lower extremity swelling or edema. Calves symmetric in size bilaterally.  Pulmonary/Chest: Effort normal and breath sounds normal. He exhibits no tenderness.  Abdominal: Soft. Bowel sounds are normal. There is no  tenderness. There is no rebound and no guarding.  Musculoskeletal: He exhibits no edema.  Lymphadenopathy:    He has cervical adenopathy.  Neurological: He is alert.  Skin: Skin is warm and dry. No rash noted. He is not diaphoretic.  Psychiatric: He has a normal mood and affect.  Nursing note and vitals reviewed.    ED Treatments / Results  Labs (all labs ordered are listed, but only abnormal results are displayed) Labs Reviewed  RAPID STREP SCREEN ([facility] ONLY) - Abnormal; Notable for the following components:      Result Value   Streptococcus, Group A Screen (Direct) POSITIVE (*)    All other components within normal limits    EKG None  Radiology No results found.  Procedures Procedures (including critical care time)  Medications Ordered in ED Medications  ibuprofen (ADVIL,MOTRIN) tablet 800 mg (800 mg Oral Given 06/27/17 2010)  penicillin g benzathine (BICILLIN LA) 1200000 UNIT/2ML injection 1.2 Million Units (1.2 Million Units Intramuscular Given 06/27/17 2106)  predniSONE (DELTASONE) tablet 60 mg (60 mg Oral Given 06/27/17 2105)     Initial Impression / Assessment and Plan / ED Course  I have reviewed the triage vital signs and the nursing notes.  Pertinent labs & imaging results that were available during my care of the patient were reviewed by me and considered in my medical decision making (see chart for details).     37 y.o. male with tonsillar exudate, cervical lymphadenopathy, & dysphagia; strep test is positive. Diagnosis of bacterial pharyngitis. Treated in the ED with steroids, NSAIDs and PCN IM.  Pt appears mildly dehydrated, discussed importance of water rehydration. Presentation non concerning for PTA or RPA. No trismus or uvula deviation. Specific return precautions discussed. Pt able to drink water in ED without difficulty with intact air way. Recommended PCP follow up. Vital signs improved prior to discharge.   Vitals:   06/27/17 2004 06/27/17 2005  06/27/17 2047  BP: (!) 163/95    Pulse: (!) 104    Resp: 20    Temp: (!) 101.6 F (38.7 C)  99.7 F (37.6 C)  TempSrc: Oral  Oral  SpO2: 96%    Weight:  (!) 149.2 kg (329 lb)   Height:  5\' 9"  (1.753 m)     Final Clinical Impressions(s) / ED Diagnoses   Final diagnoses:  Strep pharyngitis    ED Discharge Orders        Ordered    predniSONE (DELTASONE) 10 MG tablet  Daily     06/27/17 2129    ibuprofen (ADVIL,MOTRIN) 600 MG tablet  Every 6 hours PRN     06/27/17 2129    lidocaine (XYLOCAINE) 2 % solution  As needed     06/27/17 2129       Princella Pellegrini 06/28/17 0059    Tegeler, Canary Brim, MD  06/28/17 0111  

## 2017-06-27 NOTE — ED Notes (Signed)
ED Provider at bedside. 

## 2017-06-27 NOTE — ED Notes (Signed)
Pt verbalizes understanding of d/c instructions and denies any further need at this time. 

## 2017-06-27 NOTE — ED Triage Notes (Signed)
C/o cough, sore throat day 2-NAD-steady gait

## 2017-06-27 NOTE — Discharge Instructions (Signed)
Please read and follow all provided instructions.  Your diagnoses today include: Strep Throat  Tests performed today include: Vital signs. See below for your results today.  Strep Test: This was positive  Medications prescribed:  You were treated in the department with penicillin. Do not take your medicine if develop an itchy rash, swelling in your mouth or lips, or difficulty breathing. Please take Ibuprofen and Tylenol as needed for fever.  Please use viscous lidocaine (swish, gargle and spit - do not swallow) for additional relief. Please take Prednisone as prescribed.   Home care instructions:  This is a bacterial infection. Continue to stay well-hydrated. Gargle warm salt water and spit it out. Continued to alternate between Tylenol and ibuprofen for pain. May consider over-the-counter Benadryl for additional relief (decrease secretions). Also discard your toothbrush and begin using a new one in 3 days. Follow-up with your primary care doctor in this week for recheck of ongoing symptoms.   Follow-up instructions: Please follow-up with your primary care provider in 2-3 days for follow up.    Return instructions:  Return to the ED sooner for worsening condition, inability to swallow, breathing difficulty, new concerns.  Additional Information:  Your vital signs today were: BP (!) 163/95 (BP Location: Left Arm)    Pulse (!) 104    Temp 99.7 F (37.6 C) (Oral)    Resp 20    Ht 5\' 9"  (1.753 m)    Wt (!) 149.2 kg (329 lb)    SpO2 96%    BMI 48.58 kg/m  If your blood pressure (BP) was elevated above 135/85 this visit, please have this repeated by your doctor within one month. ---------------

## 2017-08-28 DIAGNOSIS — Z0000 Encounter for general adult medical examination without abnormal findings: Secondary | ICD-10-CM | POA: Diagnosis not present

## 2017-08-28 DIAGNOSIS — Z01118 Encounter for examination of ears and hearing with other abnormal findings: Secondary | ICD-10-CM | POA: Diagnosis not present

## 2017-08-28 DIAGNOSIS — Z131 Encounter for screening for diabetes mellitus: Secondary | ICD-10-CM | POA: Diagnosis not present

## 2017-08-28 DIAGNOSIS — Z136 Encounter for screening for cardiovascular disorders: Secondary | ICD-10-CM | POA: Diagnosis not present

## 2017-09-25 DIAGNOSIS — Z01118 Encounter for examination of ears and hearing with other abnormal findings: Secondary | ICD-10-CM | POA: Diagnosis not present

## 2017-09-25 DIAGNOSIS — Z0000 Encounter for general adult medical examination without abnormal findings: Secondary | ICD-10-CM | POA: Diagnosis not present

## 2017-09-25 DIAGNOSIS — I10 Essential (primary) hypertension: Secondary | ICD-10-CM | POA: Diagnosis not present

## 2017-09-25 DIAGNOSIS — Z136 Encounter for screening for cardiovascular disorders: Secondary | ICD-10-CM | POA: Diagnosis not present

## 2017-09-25 DIAGNOSIS — Z5181 Encounter for therapeutic drug level monitoring: Secondary | ICD-10-CM | POA: Diagnosis not present

## 2017-09-25 DIAGNOSIS — Z131 Encounter for screening for diabetes mellitus: Secondary | ICD-10-CM | POA: Diagnosis not present

## 2017-09-25 DIAGNOSIS — R7303 Prediabetes: Secondary | ICD-10-CM | POA: Diagnosis not present

## 2017-09-25 DIAGNOSIS — I119 Hypertensive heart disease without heart failure: Secondary | ICD-10-CM | POA: Diagnosis not present

## 2017-09-25 DIAGNOSIS — Z720 Tobacco use: Secondary | ICD-10-CM | POA: Diagnosis not present

## 2017-10-26 DIAGNOSIS — R221 Localized swelling, mass and lump, neck: Secondary | ICD-10-CM | POA: Diagnosis not present

## 2017-10-26 DIAGNOSIS — J029 Acute pharyngitis, unspecified: Secondary | ICD-10-CM | POA: Diagnosis not present

## 2017-10-26 DIAGNOSIS — F17210 Nicotine dependence, cigarettes, uncomplicated: Secondary | ICD-10-CM | POA: Diagnosis not present

## 2017-10-26 DIAGNOSIS — H9202 Otalgia, left ear: Secondary | ICD-10-CM | POA: Diagnosis not present

## 2017-10-26 DIAGNOSIS — R0603 Acute respiratory distress: Secondary | ICD-10-CM | POA: Diagnosis not present

## 2017-10-26 DIAGNOSIS — I10 Essential (primary) hypertension: Secondary | ICD-10-CM | POA: Diagnosis not present

## 2017-10-26 DIAGNOSIS — J36 Peritonsillar abscess: Secondary | ICD-10-CM | POA: Diagnosis not present

## 2017-10-26 DIAGNOSIS — R590 Localized enlarged lymph nodes: Secondary | ICD-10-CM | POA: Diagnosis not present

## 2017-12-22 DIAGNOSIS — G4733 Obstructive sleep apnea (adult) (pediatric): Secondary | ICD-10-CM | POA: Diagnosis not present

## 2018-01-22 DIAGNOSIS — G4733 Obstructive sleep apnea (adult) (pediatric): Secondary | ICD-10-CM | POA: Diagnosis not present

## 2018-02-22 DIAGNOSIS — G4733 Obstructive sleep apnea (adult) (pediatric): Secondary | ICD-10-CM | POA: Diagnosis not present

## 2018-03-19 DIAGNOSIS — H5789 Other specified disorders of eye and adnexa: Secondary | ICD-10-CM | POA: Diagnosis not present

## 2018-03-19 DIAGNOSIS — H579 Unspecified disorder of eye and adnexa: Secondary | ICD-10-CM | POA: Diagnosis not present

## 2018-03-19 DIAGNOSIS — I517 Cardiomegaly: Secondary | ICD-10-CM | POA: Diagnosis not present

## 2018-03-19 DIAGNOSIS — I10 Essential (primary) hypertension: Secondary | ICD-10-CM | POA: Diagnosis not present

## 2018-03-19 DIAGNOSIS — H1031 Unspecified acute conjunctivitis, right eye: Secondary | ICD-10-CM | POA: Diagnosis not present

## 2018-03-23 DIAGNOSIS — G4733 Obstructive sleep apnea (adult) (pediatric): Secondary | ICD-10-CM | POA: Diagnosis not present

## 2018-04-23 DIAGNOSIS — G4733 Obstructive sleep apnea (adult) (pediatric): Secondary | ICD-10-CM | POA: Diagnosis not present

## 2018-05-23 DIAGNOSIS — G4733 Obstructive sleep apnea (adult) (pediatric): Secondary | ICD-10-CM | POA: Diagnosis not present

## 2018-06-23 DIAGNOSIS — G4733 Obstructive sleep apnea (adult) (pediatric): Secondary | ICD-10-CM | POA: Diagnosis not present

## 2018-07-23 DIAGNOSIS — G4733 Obstructive sleep apnea (adult) (pediatric): Secondary | ICD-10-CM | POA: Diagnosis not present

## 2018-08-23 DIAGNOSIS — G4733 Obstructive sleep apnea (adult) (pediatric): Secondary | ICD-10-CM | POA: Diagnosis not present

## 2018-12-18 ENCOUNTER — Emergency Department (HOSPITAL_BASED_OUTPATIENT_CLINIC_OR_DEPARTMENT_OTHER): Payer: BC Managed Care – PPO

## 2018-12-18 ENCOUNTER — Other Ambulatory Visit: Payer: Self-pay

## 2018-12-18 ENCOUNTER — Emergency Department (HOSPITAL_BASED_OUTPATIENT_CLINIC_OR_DEPARTMENT_OTHER)
Admission: EM | Admit: 2018-12-18 | Discharge: 2018-12-19 | Disposition: A | Discharge status: Home / Self Care | Payer: BC Managed Care – PPO | Attending: Emergency Medicine | Admitting: Emergency Medicine

## 2018-12-18 ENCOUNTER — Encounter (HOSPITAL_BASED_OUTPATIENT_CLINIC_OR_DEPARTMENT_OTHER): Payer: Self-pay

## 2018-12-18 DIAGNOSIS — M1711 Unilateral primary osteoarthritis, right knee: Secondary | ICD-10-CM | POA: Diagnosis not present

## 2018-12-18 DIAGNOSIS — I10 Essential (primary) hypertension: Secondary | ICD-10-CM | POA: Diagnosis not present

## 2018-12-18 DIAGNOSIS — M25461 Effusion, right knee: Secondary | ICD-10-CM | POA: Diagnosis not present

## 2018-12-18 DIAGNOSIS — M25561 Pain in right knee: Secondary | ICD-10-CM | POA: Diagnosis not present

## 2018-12-18 DIAGNOSIS — Z87891 Personal history of nicotine dependence: Secondary | ICD-10-CM | POA: Diagnosis not present

## 2018-12-18 MED ORDER — DICLOFENAC SODIUM 1 % EX GEL: 2.0000 g | Freq: Four times a day (QID) | CUTANEOUS | 1 refills | Status: AC

## 2018-12-18 NOTE — Discharge Instructions (Addendum)
Please continue to use cold therapy, you may also use warm packs to the cells.  Exercise, low impact such as cycling or swimming.  Losing weight can help with osteoarthritis.  Occupational therapy may be beneficial as well.  I prescribed you topical anti-inflammatory that you can use on your knee.  You may also take Tylenol. Avoid ibuprofen since you have a history of cardiomyopathy.  You may follow-up with Dr. Raeford Razor.  [facility]. You may also choose to follow-up with your primary care doctor.  Is using the brace for comfort.  You may apply topical gel to the knee as needed for pain.

## 2018-12-18 NOTE — ED Provider Notes (Addendum)
[facility] EMERGENCY DEPARTMENT Provider Note   CSN: [id number] Arrival date & time: 12/18/18  2158     History   Chief Complaint Chief Complaint  Patient presents with  . Knee Pain    HPI Seth Wang is a 38 y.o. male.     HPI  Patient with history of bilateral knee osteoarthritis presents today with right-sided knee pain that is resolved today however is progressively worse over this past week when he is working 12-hour shifts on his feet at a [employer].  Patient states that his right knee would get swollen at times.  States this is happened in the past and resolved without any medications or intervention.  States that at the time he was told that he had osteoarthritis by his primary care doctor who has not seen for the past year.  Patient denies any fevers, chills, redness or tenderness of any during swelling.  Patient states that he went to bed Sunday night when he finishes left shift with an ice pack on his knee and woke up Monday morning with the swelling completely resolved and no pain.  States that he came in today because he was reading online that fluid in the knee joint can cause disintegration of cartilage.  Denies any current pain, pain with movement, pain with weightbearing, difficulty walking.  No history of IV drug use, immunosuppression, HIV.  States only medical problems are hypertension and enlarged heart.  Past Medical History:  Diagnosis Date  . Enlarged heart   . Hypertension     Patient Active Problem List   Diagnosis Date Noted  . Enlarged heart   . HEADACHE 09/21/2006    History reviewed. No pertinent surgical history.      Home Medications    Prior to Admission medications   Medication Sig Start Date End Date Taking? Authorizing Provider  diclofenac Sodium (VOLTAREN) 1 % GEL Apply 2 g topically 4 (four) times daily for 19 days. 12/18/18 01/06/19  Gailen ShelterFondaw, Napoleon Monacelli S, PA  hydrochlorothiazide (HYDRODIURIL) 25 MG tablet Take 1 tablet  (25 mg total) by mouth daily. 02/26/16   Fayrene Helperran, [place], PA-C  HYDROcodone-homatropine [facility](HYCODAN) 5-1.5 MG/5ML syrup Take 5 mLs by mouth every 6 (six) hours as needed for cough. 01/24/16   Gwyneth SproutPlunkett, Whitney, MD  ibuprofen (ADVIL,MOTRIN) 600 MG tablet Take 1 tablet (600 mg total) by mouth every 6 (six) hours as needed. 06/27/17   Maczis, Elmer SowMichael M, PA-C  lidocaine (XYLOCAINE) 2 % solution Use as directed 15 mLs in the mouth or throat as needed for mouth pain. 06/27/17   Maczis, Elmer SowMichael M, PA-C    Family History History reviewed. No pertinent family history.  Social History Social History   Tobacco Use  . Smoking status: Former Smoker    Packs/day: 0.50    Types: Cigarettes  . Smokeless tobacco: Never Used  Substance Use Topics  . Alcohol use: Yes    Comment: occ  . Drug use: No     Allergies   Patient has no known allergies.   Review of Systems Review of Systems  Constitutional: Negative for chills and fever.  HENT: Negative for congestion.   Eyes: Negative for pain.  Respiratory: Negative for cough and shortness of breath.   Cardiovascular: Negative for chest pain and leg swelling.  Gastrointestinal: Negative for abdominal pain and vomiting.  Genitourinary: Negative for dysuria.  Musculoskeletal: Negative for myalgias.  Skin: Negative for rash.  Neurological: Negative for dizziness and headaches.     Physical Exam  Updated Vital Signs BP (!) 151/91 (BP Location: Right Arm)   Pulse 93   Temp 98.2 F (36.8 C) (Oral)   Resp 16   Ht 5' 9.75" (1.772 m)   Wt (!) 173.3 kg   SpO2 100%   BMI 55.20 kg/m   Physical Exam Vitals signs and nursing note reviewed.  Constitutional:      General: He is not in acute distress.    Appearance: Normal appearance. He is not ill-appearing.  HENT:     Head: Normocephalic and atraumatic.  Eyes:     General: No scleral icterus.       Right eye: No discharge.        Left eye: No discharge.     Conjunctiva/sclera: Conjunctivae normal.   Pulmonary:     Effort: Pulmonary effort is normal.     Breath sounds: No stridor.  Musculoskeletal:     Comments: Normal gait.  No difficulty weightbearing.  No difficulty standing or sitting. No tenderness to palpation of the right knee.  No obvious joint effusion.  Negative balloon and ballottement.  No tenderness the posterior knee.  Sensation intact bilateral lower extremities.  Flexion extension 5/5 bilateral knees. FROM bilateral knees.  Neurological:     Mental Status: He is alert and oriented to person, place, and time. Mental status is at baseline.      ED Treatments / Results  Labs (all labs ordered are listed, but only abnormal results are displayed) Labs Reviewed - No data to display  EKG None  Radiology Dg Knee Complete 4 Views Right  Result Date: 12/18/2018 CLINICAL DATA:  Knee pain EXAM: RIGHT KNEE - COMPLETE 4+ VIEW COMPARISON:  None. FINDINGS: No evidence of fracture, or dislocation. There is a small knee joint effusion. Tiny osteophyte seen at the patellofemoral compartment. IMPRESSION: No acute osseous abnormality.  Small knee joint effusion Electronically Signed   By: Jonna Clark M.D.   On: 12/18/2018 23:55    Procedures Procedures (including critical care time)  Medications Ordered in ED Medications - No data to display   Initial Impression / Assessment and Plan / ED Course  I have reviewed the triage vital signs and the nursing notes.  Pertinent labs & imaging results that were available during my care of the patient were reviewed by me and considered in my medical decision making (see chart for details).        Patient presents today for right knee pain which is completely resolved at this time.  Patient states he also had swelling which is also resolved.  She has history of osteoarthritis.  He also is overweight and is working 12-hour shifts multiple days in a [employer].  Suspect that this is due to his osteoarthritis.  Doubt septic joint as  patient has no symptoms now and symptoms completely improved with rest and elevation with ice.  Patient has no systemic symptoms of infection.   I discussed this case with my attending physician who cosigned this note including patient's presenting symptoms, physical exam, and planned diagnostics and interventions. Attending physician stated agreement with plan or made changes to plan which were implemented.   No indication for any aspiration at this time as there is no fluid evident on my exam.  Xray shows small effusion. As patient is asymptomatic I will not aspirate at this time.   We will recommend conservative management and follow-up with sports medicine.  Will give any brace and topical diclofenac recommend Tylenol.   Return precautions  for symptoms including fevers, warmth, erythema, painful range of motion or other indications of septic arthritis.     This patient appears reasonably screened and I doubt any other medical condition requiring further workup, evaluation, or treatment in the ED at this time prior to discharge.   Patient's vitals are WNL apart from vital sign abnormalities discussed above, patient is in NAD, and able to ambulate in the ED at their baseline. Pain has been managed or a plan has been made for home management and has no complaints prior to discharge. Patient is comfortable with above plan and is stable for discharge at this time. All questions were answered prior to disposition. Results from the ER workup discussed with the patient face to face and all questions answered to the best of my ability. The patient is safe for discharge with strict return precautions. Patient appears safe for discharge with appropriate follow-up. Conveyed my impression with the patient and they voiced understanding and are agreeable to plan.   An After Visit Summary was printed and given to the patient.  Portions of this note were generated with [employer]. Dictation  errors may occur despite best attempts at proofreading.     Final Clinical Impressions(s) / ED Diagnoses   Final diagnoses:  Osteoarthritis of right knee, unspecified osteoarthritis type    ED Discharge Orders         Ordered    diclofenac Sodium (VOLTAREN) 1 % GEL  4 times daily     12/18/18 2339           Tedd Sias, [place] 12/19/18 0006    Tegeler, Gwenyth Allegra, MD 12/19/18 0007    Tedd Sias, PA 12/19/18 0013    Tegeler, Gwenyth Allegra, MD 12/19/18 0020

## 2018-12-18 NOTE — ED Triage Notes (Signed)
Right knee pain and swelling x1week, hx of same 3-4 years ago. Denies injury.

## 2019-01-07 DIAGNOSIS — Z03818 Encounter for observation for suspected exposure to other biological agents ruled out: Secondary | ICD-10-CM | POA: Diagnosis not present

## 2019-04-12 DIAGNOSIS — I119 Hypertensive heart disease without heart failure: Secondary | ICD-10-CM | POA: Diagnosis not present

## 2019-04-12 DIAGNOSIS — Z720 Tobacco use: Secondary | ICD-10-CM | POA: Diagnosis not present

## 2019-04-12 DIAGNOSIS — Z1329 Encounter for screening for other suspected endocrine disorder: Secondary | ICD-10-CM | POA: Diagnosis not present

## 2019-04-12 DIAGNOSIS — I10 Essential (primary) hypertension: Secondary | ICD-10-CM | POA: Diagnosis not present

## 2019-04-12 DIAGNOSIS — R7303 Prediabetes: Secondary | ICD-10-CM | POA: Diagnosis not present

## 2019-04-12 DIAGNOSIS — Z131 Encounter for screening for diabetes mellitus: Secondary | ICD-10-CM | POA: Diagnosis not present

## 2019-04-12 DIAGNOSIS — Z0001 Encounter for general adult medical examination with abnormal findings: Secondary | ICD-10-CM | POA: Diagnosis not present

## 2019-04-12 DIAGNOSIS — Z136 Encounter for screening for cardiovascular disorders: Secondary | ICD-10-CM | POA: Diagnosis not present

## 2019-04-29 DIAGNOSIS — R7401 Elevation of levels of liver transaminase levels: Secondary | ICD-10-CM | POA: Diagnosis not present

## 2021-03-12 IMAGING — DX DG KNEE COMPLETE 4+V*R*
4 series · 4 of 4 positions shown · non-contrast
Comparison: None.

CLINICAL DATA: Knee pain

EXAM:
RIGHT KNEE - COMPLETE 4+ VIEW

[knee ap]
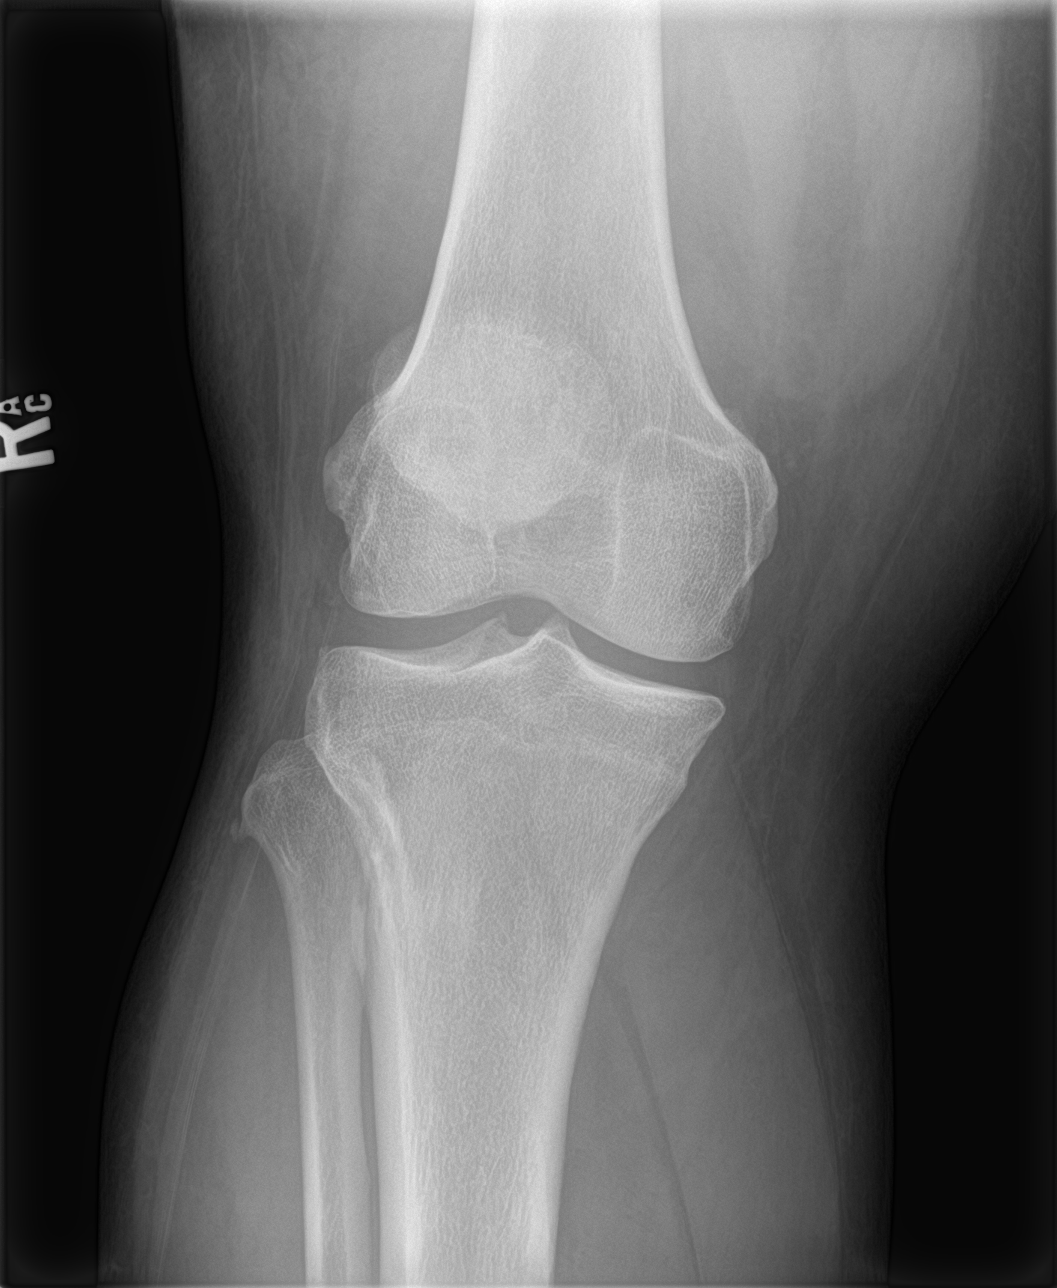

[knee lat]
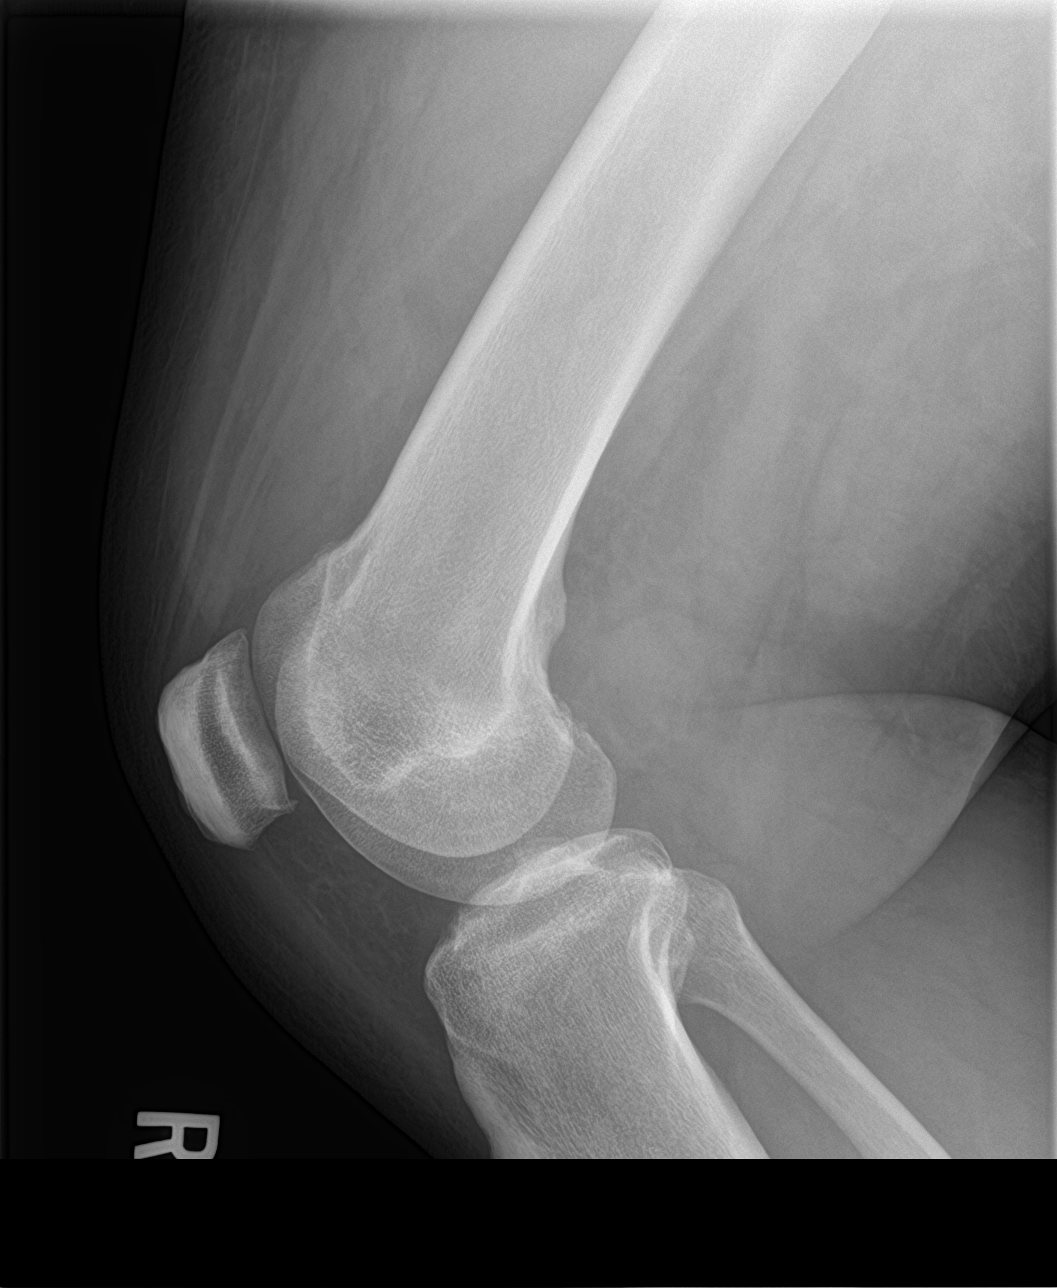

[knee obl (1 of 2)]
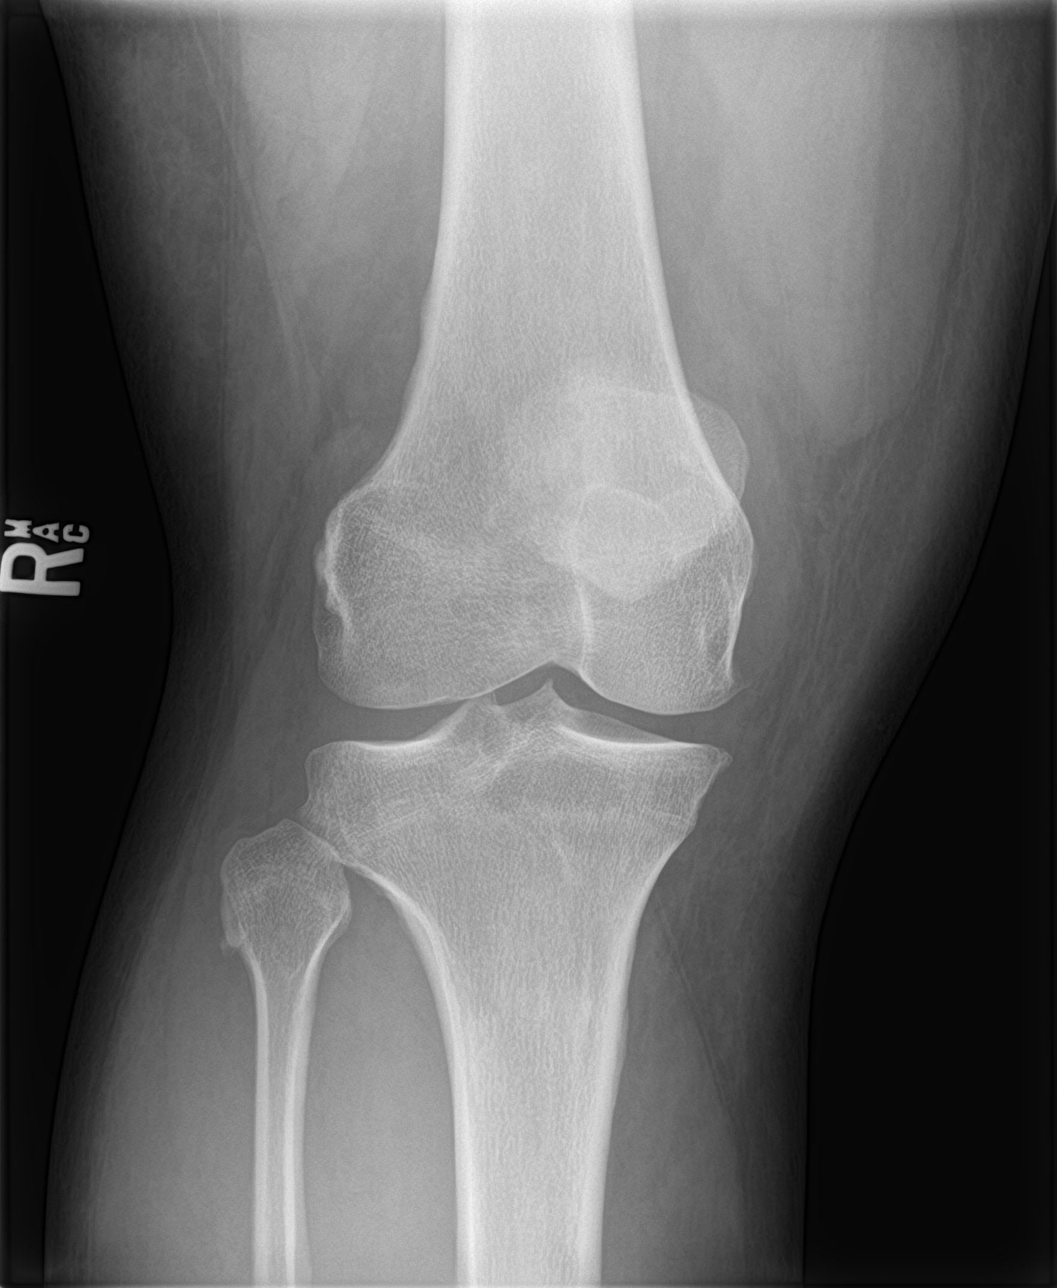

[knee obl (2 of 2)]
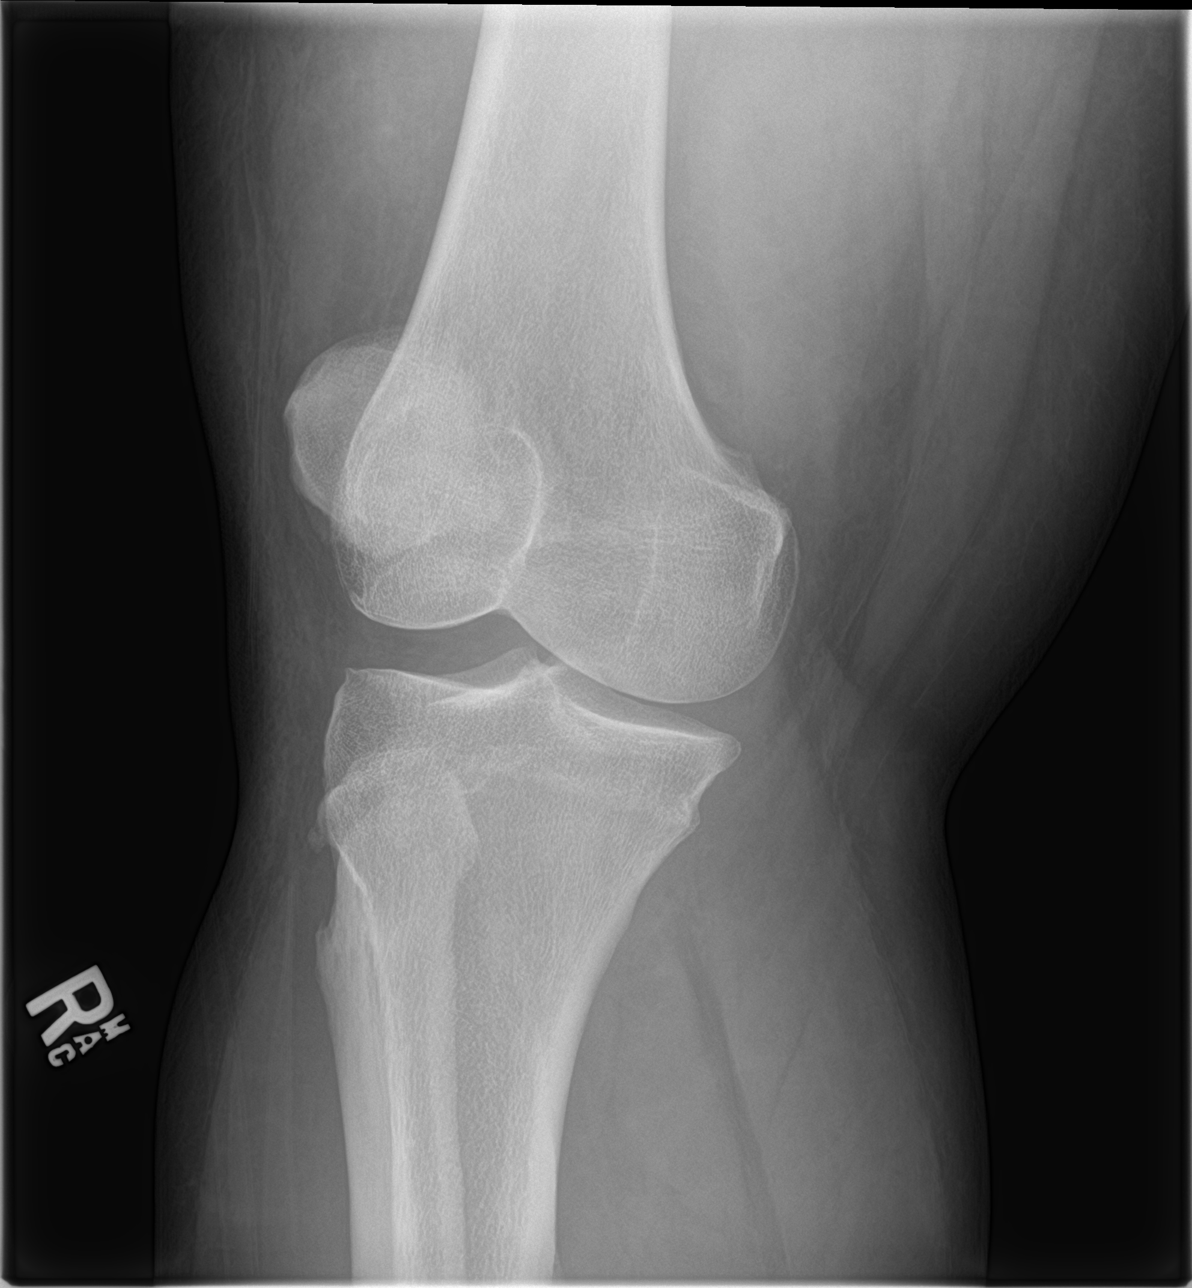

[4 of 4 positions shown; findings below may reference images not displayed]

FINDINGS: No evidence of fracture, or dislocation. There is a small knee joint
effusion. Tiny osteophyte seen at the patellofemoral compartment.
IMPRESSION: No acute osseous abnormality.  Small knee joint effusion

## 2021-06-01 DIAGNOSIS — Z0001 Encounter for general adult medical examination with abnormal findings: Secondary | ICD-10-CM | POA: Diagnosis not present

## 2021-06-01 DIAGNOSIS — E559 Vitamin D deficiency, unspecified: Secondary | ICD-10-CM | POA: Diagnosis not present

## 2021-06-01 DIAGNOSIS — R7303 Prediabetes: Secondary | ICD-10-CM | POA: Diagnosis not present

## 2021-06-01 DIAGNOSIS — I10 Essential (primary) hypertension: Secondary | ICD-10-CM | POA: Diagnosis not present

## 2021-06-01 DIAGNOSIS — R3589 Other polyuria: Secondary | ICD-10-CM | POA: Diagnosis not present

## 2021-06-01 DIAGNOSIS — N3001 Acute cystitis with hematuria: Secondary | ICD-10-CM | POA: Diagnosis not present

## 2021-06-01 DIAGNOSIS — G4733 Obstructive sleep apnea (adult) (pediatric): Secondary | ICD-10-CM | POA: Diagnosis not present

## 2021-10-08 DIAGNOSIS — F17210 Nicotine dependence, cigarettes, uncomplicated: Secondary | ICD-10-CM | POA: Diagnosis not present

## 2021-10-08 DIAGNOSIS — Z6841 Body Mass Index (BMI) 40.0 and over, adult: Secondary | ICD-10-CM | POA: Diagnosis not present

## 2021-10-08 DIAGNOSIS — G4733 Obstructive sleep apnea (adult) (pediatric): Secondary | ICD-10-CM | POA: Diagnosis not present

## 2021-10-13 DIAGNOSIS — G4733 Obstructive sleep apnea (adult) (pediatric): Secondary | ICD-10-CM | POA: Diagnosis not present

## 2021-11-12 DIAGNOSIS — G4733 Obstructive sleep apnea (adult) (pediatric): Secondary | ICD-10-CM | POA: Diagnosis not present

## 2021-12-13 DIAGNOSIS — G4733 Obstructive sleep apnea (adult) (pediatric): Secondary | ICD-10-CM | POA: Diagnosis not present

## 2023-09-11 DIAGNOSIS — Z0001 Encounter for general adult medical examination with abnormal findings: Secondary | ICD-10-CM | POA: Diagnosis not present

## 2023-09-11 DIAGNOSIS — R7303 Prediabetes: Secondary | ICD-10-CM | POA: Diagnosis not present

## 2023-09-11 DIAGNOSIS — N3000 Acute cystitis without hematuria: Secondary | ICD-10-CM | POA: Diagnosis not present

## 2023-09-11 DIAGNOSIS — E559 Vitamin D deficiency, unspecified: Secondary | ICD-10-CM | POA: Diagnosis not present

## 2023-09-11 DIAGNOSIS — I10 Essential (primary) hypertension: Secondary | ICD-10-CM | POA: Diagnosis not present
